# Patient Record
Sex: Male | Born: 2000 | Race: White | Hispanic: No | Marital: Single
Health system: Southern US, Community
[De-identification: ages and names within clinical notes are randomized; demographics above are authoritative.]

---
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
## 2022-07-17 DIAGNOSIS — J301 Allergic rhinitis due to pollen: Secondary | ICD-10-CM | POA: Diagnosis not present

## 2022-07-17 DIAGNOSIS — J3081 Allergic rhinitis due to animal (cat) (dog) hair and dander: Secondary | ICD-10-CM | POA: Diagnosis not present

## 2022-07-17 DIAGNOSIS — J3089 Other allergic rhinitis: Secondary | ICD-10-CM | POA: Diagnosis not present

## 2022-07-17 DIAGNOSIS — Z91013 Allergy to seafood: Secondary | ICD-10-CM | POA: Diagnosis not present

## 2022-10-05 DIAGNOSIS — Z23 Encounter for immunization: Secondary | ICD-10-CM | POA: Diagnosis not present

## 2023-01-31 DIAGNOSIS — L659 Nonscarring hair loss, unspecified: Secondary | ICD-10-CM | POA: Diagnosis not present

## 2023-05-28 DIAGNOSIS — Z111 Encounter for screening for respiratory tuberculosis: Secondary | ICD-10-CM | POA: Diagnosis not present

## 2023-06-27 DIAGNOSIS — Z111 Encounter for screening for respiratory tuberculosis: Secondary | ICD-10-CM | POA: Diagnosis not present

## 2023-06-29 DIAGNOSIS — Z111 Encounter for screening for respiratory tuberculosis: Secondary | ICD-10-CM | POA: Diagnosis not present

## 2023-07-06 DIAGNOSIS — Z111 Encounter for screening for respiratory tuberculosis: Secondary | ICD-10-CM | POA: Diagnosis not present

## 2023-07-08 DIAGNOSIS — Z111 Encounter for screening for respiratory tuberculosis: Secondary | ICD-10-CM | POA: Diagnosis not present

## 2023-07-16 DIAGNOSIS — Z91013 Allergy to seafood: Secondary | ICD-10-CM | POA: Diagnosis not present

## 2023-07-16 DIAGNOSIS — J3089 Other allergic rhinitis: Secondary | ICD-10-CM | POA: Diagnosis not present

## 2023-07-16 DIAGNOSIS — J3081 Allergic rhinitis due to animal (cat) (dog) hair and dander: Secondary | ICD-10-CM | POA: Diagnosis not present

## 2023-07-16 DIAGNOSIS — J301 Allergic rhinitis due to pollen: Secondary | ICD-10-CM | POA: Diagnosis not present

## 2023-12-06 DIAGNOSIS — Z1589 Genetic susceptibility to other disease: Secondary | ICD-10-CM | POA: Diagnosis not present

## 2023-12-06 DIAGNOSIS — Z13220 Encounter for screening for lipoid disorders: Secondary | ICD-10-CM | POA: Diagnosis not present

## 2023-12-06 DIAGNOSIS — L659 Nonscarring hair loss, unspecified: Secondary | ICD-10-CM | POA: Diagnosis not present

## 2023-12-06 DIAGNOSIS — Z91013 Allergy to seafood: Secondary | ICD-10-CM | POA: Diagnosis not present

## 2023-12-06 DIAGNOSIS — Z79899 Other long term (current) drug therapy: Secondary | ICD-10-CM | POA: Diagnosis not present

## 2024-06-30 DIAGNOSIS — Z111 Encounter for screening for respiratory tuberculosis: Secondary | ICD-10-CM | POA: Diagnosis not present

## 2024-07-01 DIAGNOSIS — Z008 Encounter for other general examination: Secondary | ICD-10-CM | POA: Diagnosis not present

## 2024-07-01 DIAGNOSIS — Z6823 Body mass index (BMI) 23.0-23.9, adult: Secondary | ICD-10-CM | POA: Diagnosis not present

## 2024-07-01 DIAGNOSIS — L239 Allergic contact dermatitis, unspecified cause: Secondary | ICD-10-CM | POA: Diagnosis not present

## 2024-07-01 DIAGNOSIS — Z23 Encounter for immunization: Secondary | ICD-10-CM | POA: Diagnosis not present

## 2024-07-02 DIAGNOSIS — Z111 Encounter for screening for respiratory tuberculosis: Secondary | ICD-10-CM | POA: Diagnosis not present

## 2024-07-02 DIAGNOSIS — Z6823 Body mass index (BMI) 23.0-23.9, adult: Secondary | ICD-10-CM | POA: Diagnosis not present

## 2024-07-14 DIAGNOSIS — Z111 Encounter for screening for respiratory tuberculosis: Secondary | ICD-10-CM | POA: Diagnosis not present

## 2024-07-16 DIAGNOSIS — Z6823 Body mass index (BMI) 23.0-23.9, adult: Secondary | ICD-10-CM | POA: Diagnosis not present

## 2024-07-16 DIAGNOSIS — Z111 Encounter for screening for respiratory tuberculosis: Secondary | ICD-10-CM | POA: Diagnosis not present

## 2024-07-31 DIAGNOSIS — Z23 Encounter for immunization: Secondary | ICD-10-CM | POA: Diagnosis not present

## 2024-07-31 DIAGNOSIS — Z0184 Encounter for antibody response examination: Secondary | ICD-10-CM | POA: Diagnosis not present

## 2024-07-31 DIAGNOSIS — Z681 Body mass index (BMI) 19 or less, adult: Secondary | ICD-10-CM | POA: Diagnosis not present
